# Patient Record
Sex: Male | Born: 1980 | Race: White | Hispanic: No | Marital: Single | Smoking: Current every day smoker
Health system: Southern US, Community
[De-identification: ages and names within clinical notes are randomized; demographics above are authoritative.]

---

## 2013-11-05 ENCOUNTER — Emergency Department (HOSPITAL_COMMUNITY)
Admission: EM | Admit: 2013-11-05 | Discharge: 2013-11-05 | Disposition: A | Discharge status: Home / Self Care | Payer: Medicaid - Out of State | Attending: Emergency Medicine | Admitting: Emergency Medicine

## 2013-11-05 ENCOUNTER — Emergency Department (HOSPITAL_COMMUNITY): Payer: Medicaid - Out of State

## 2013-11-05 ENCOUNTER — Encounter (HOSPITAL_COMMUNITY): Payer: Self-pay | Admitting: Emergency Medicine

## 2013-11-05 DIAGNOSIS — W208XXA Other cause of strike by thrown, projected or falling object, initial encounter: Secondary | ICD-10-CM | POA: Insufficient documentation

## 2013-11-05 DIAGNOSIS — Y9389 Activity, other specified: Secondary | ICD-10-CM | POA: Insufficient documentation

## 2013-11-05 DIAGNOSIS — S8990XA Unspecified injury of unspecified lower leg, initial encounter: Secondary | ICD-10-CM | POA: Diagnosis present

## 2013-11-05 DIAGNOSIS — F17200 Nicotine dependence, unspecified, uncomplicated: Secondary | ICD-10-CM | POA: Insufficient documentation

## 2013-11-05 DIAGNOSIS — S90122A Contusion of left lesser toe(s) without damage to nail, initial encounter: Secondary | ICD-10-CM

## 2013-11-05 DIAGNOSIS — Y9289 Other specified places as the place of occurrence of the external cause: Secondary | ICD-10-CM | POA: Diagnosis not present

## 2013-11-05 DIAGNOSIS — S99929A Unspecified injury of unspecified foot, initial encounter: Secondary | ICD-10-CM | POA: Diagnosis present

## 2013-11-05 DIAGNOSIS — S90129A Contusion of unspecified lesser toe(s) without damage to nail, initial encounter: Secondary | ICD-10-CM | POA: Insufficient documentation

## 2013-11-05 DIAGNOSIS — S99919A Unspecified injury of unspecified ankle, initial encounter: Secondary | ICD-10-CM

## 2013-11-05 MED ORDER — HYDROCODONE-ACETAMINOPHEN 5-325 MG PO TABS: 1.0000 | ORAL_TABLET | ORAL | Status: AC | PRN

## 2013-11-05 NOTE — ED Notes (Signed)
Pt in radiology 

## 2013-11-05 NOTE — ED Notes (Signed)
Declined W/C at D/C and was escorted to lobby by RN. 

## 2013-11-05 NOTE — ED Notes (Signed)
The pt has injured his lt little toe when a semi skirt fell onto it  1400 today bruised and swollen

## 2013-11-05 NOTE — ED Provider Notes (Signed)
CSN: [id number]     Arrival date & time 11/05/13  1649 History  This chart was scribed for Elpidio Anis, PA-C working with Samuel Jester, DO by Evon Slack, ED Scribe. This patient was seen in room TR04C/TR04C and the patient's care was started at 5:08 PM.    Chief Complaint  Patient presents with  . Toe Injury   The history is provided by the patient. No language interpreter was used.   HPI Comments: Benjamin Rowe is a 33 y.o. male who presents to the Emergency Department complaining of left crush injury to his left little toe onset 2PM today. He states that he dropped a semi side skirt on his foot. He denies numbness or tingling.    History reviewed. No pertinent past medical history. History reviewed. No pertinent past surgical history. No family history on file. History  Substance Use Topics  . Smoking status: Current Every Day Smoker  . Smokeless tobacco: Not on file  . Alcohol Use: Yes    Review of Systems  Musculoskeletal: Positive for arthralgias.  Skin: Positive for color change.  Neurological: Negative for numbness.    Allergies  Gabapentin  Home Medications   Prior to Admission medications   Not on File   Triage Vitals: BP 161/99  Pulse 92  Temp(Src) 99.4 F (37.4 C)  Resp 16  Ht 6' (1.829 m)  Wt 256 lb (116.121 kg)  BMI 34.71 kg/m2  SpO2 98%  Physical Exam  Nursing note and vitals reviewed. Constitutional: He is oriented to person, place, and time. He appears well-developed and well-nourished. No distress.  HENT:  Head: Normocephalic and atraumatic.  Eyes: Conjunctivae and EOM are normal.  Neck: Neck supple. No tracheal deviation present.  Cardiovascular: Normal rate.   Pulmonary/Chest: Effort normal. No respiratory distress.  Musculoskeletal: Normal range of motion. He exhibits tenderness.  5th toe of left foot almost completely ecchymotic, Neurovascularly Intact, nail is intact, no avulsion, 4th toe tender wit no deformity  or  discoloration, no metatarsal tenderness  Neurological: He is alert and oriented to person, place, and time.  Skin: Skin is warm and dry.  Psychiatric: He has a normal mood and affect. His behavior is normal.    ED Course  Procedures (including critical care time) DIAGNOSTIC STUDIES: Oxygen Saturation is 98% on RA, normal by my interpretation.    COORDINATION OF CARE: 5:15 PM-Discussed treatment plan which includes left 5th toe X-ray with pt at bedside and pt agreed to plan.     Labs Review Labs Reviewed - No data to display  Imaging Review No results found.   EKG Interpretation None      MDM   Final diagnoses:  None   1. Contusion, left 5th toe  No fracture on plain film x-ray. No nail avulsion. Toes buddy taped for comfort. Pain management provided.    I personally performed the services described in this documentation, which was scribed in my presence. The recorded information has been reviewed and is accurate.       Arnoldo Hooker, PA-C 11/05/13 1859

## 2013-11-05 NOTE — Discharge Instructions (Signed)
Contusion °A contusion is a deep bruise. Contusions are the result of an injury that caused bleeding under the skin. The contusion may turn blue, purple, or yellow. Minor injuries will give you a painless contusion, but more severe contusions may stay painful and swollen for a few weeks.  °CAUSES  °A contusion is usually caused by a blow, trauma, or direct force to an area of the body. °SYMPTOMS  °· Swelling and redness of the injured area. °· Bruising of the injured area. °· Tenderness and soreness of the injured area. °· Pain. °DIAGNOSIS  °The diagnosis can be made by taking a history and physical exam. An X-ray, CT scan, or MRI may be needed to determine if there were any associated injuries, such as fractures. °TREATMENT  °Specific treatment will depend on what area of the body was injured. In general, the best treatment for a contusion is resting, icing, elevating, and applying cold compresses to the injured area. Over-the-counter medicines may also be recommended for pain control. Ask your caregiver what the best treatment is for your contusion. °HOME CARE INSTRUCTIONS  °· Put ice on the injured area. °· Put ice in a plastic bag. °· Place a towel between your skin and the bag. °· Leave the ice on for 15-20 minutes, 3-4 times a day, or as directed by your health care provider. °· Only take over-the-counter or prescription medicines for pain, discomfort, or fever as directed by your caregiver. Your caregiver may recommend avoiding anti-inflammatory medicines (aspirin, ibuprofen, and naproxen) for 48 hours because these medicines may increase bruising. °· Rest the injured area. °· If possible, elevate the injured area to reduce swelling. °SEEK IMMEDIATE MEDICAL CARE IF:  °· You have increased bruising or swelling. °· You have pain that is getting worse. °· Your swelling or pain is not relieved with medicines. °MAKE SURE YOU:  °· Understand these instructions. °· Will watch your condition. °· Will get help right  away if you are not doing well or get worse. °Document Released: 11/30/2004 Document Revised: 02/25/2013 Document Reviewed: 12/26/2010 °[employer]® Patient Information ©2015 [employer]. This information is not intended to replace advice given to you by your health care provider. Make sure you discuss any questions you have with your health care provider. ° °Cryotherapy °Cryotherapy means treatment with cold. Ice or gel packs can be used to reduce both pain and swelling. Ice is the most helpful within the first 24 to 48 hours after an injury or flare-up from overusing a muscle or joint. Sprains, strains, spasms, burning pain, shooting pain, and aches can all be eased with ice. Ice can also be used when recovering from surgery. Ice is effective, has very few side effects, and is safe for most people to use. °PRECAUTIONS  °Ice is not a safe treatment option for people with: °· Raynaud phenomenon. This is a condition affecting small blood vessels in the extremities. Exposure to cold may cause your problems to return. °· Cold hypersensitivity. There are many forms of cold hypersensitivity, including: °¨ Cold urticaria. Red, itchy hives appear on the skin when the tissues begin to warm after being iced. °¨ Cold erythema. This is a red, itchy rash caused by exposure to cold. °¨ Cold hemoglobinuria. Red blood cells break down when the tissues begin to warm after being iced. The hemoglobin that carry oxygen are passed into the urine because they cannot combine with blood proteins fast enough. °· Numbness or altered sensitivity in the area being iced. °If you have any   of the following conditions, do not use ice until you have discussed cryotherapy with your caregiver: °· Heart conditions, such as arrhythmia, angina, or chronic heart disease. °· High blood pressure. °· Healing wounds or open skin in the area being iced. °· Current infections. °· Rheumatoid arthritis. °· Poor circulation. °· Diabetes. °Ice slows the blood flow  in the region it is applied. This is beneficial when trying to stop inflamed tissues from spreading irritating chemicals to surrounding tissues. However, if you expose your skin to cold temperatures for too long or without the proper protection, you can damage your skin or nerves. Watch for signs of skin damage due to cold. °HOME CARE INSTRUCTIONS °Follow these tips to use ice and cold packs safely. °· Place a dry or damp towel between the ice and skin. A damp towel will cool the skin more quickly, so you may need to shorten the time that the ice is used. °· For a more rapid response, add gentle compression to the ice. °· Ice for no more than 10 to 20 minutes at a time. The bonier the area you are icing, the less time it will take to get the benefits of ice. °· Check your skin after 5 minutes to make sure there are no signs of a poor response to cold or skin damage. °· Rest 20 minutes or more between uses. °· Once your skin is numb, you can end your treatment. You can test numbness by very lightly touching your skin. The touch should be so light that you do not see the skin dimple from the pressure of your fingertip. When using ice, most people will feel these normal sensations in this order: cold, burning, aching, and numbness. °· Do not use ice on someone who cannot communicate their responses to pain, such as small children or people with dementia. °HOW TO MAKE AN ICE PACK °Ice packs are the most common way to use ice therapy. Other methods include ice massage, ice baths, and cryosprays. Muscle creams that cause a cold, tingly feeling do not offer the same benefits that ice offers and should not be used as a substitute unless recommended by your caregiver. °To make an ice pack, do one of the following: °· Place crushed ice or a bag of frozen vegetables in a sealable plastic bag. Squeeze out the excess air. Place this bag inside another plastic bag. Slide the bag into a pillowcase or place a damp towel between  your skin and the bag. °· Mix 3 parts water with 1 part rubbing alcohol. Freeze the mixture in a sealable plastic bag. When you remove the mixture from the freezer, it will be slushy. Squeeze out the excess air. Place this bag inside another plastic bag. Slide the bag into a pillowcase or place a damp towel between your skin and the bag. °SEEK MEDICAL CARE IF: °· You develop white spots on your skin. This may give the skin a blotchy (mottled) appearance. °· Your skin turns blue or pale. °· Your skin becomes waxy or hard. °· Your swelling gets worse. °MAKE SURE YOU:  °· Understand these instructions. °· Will watch your condition. °· Will get help right away if you are not doing well or get worse. °Document Released: 10/17/2010 Document Revised: 07/07/2013 Document Reviewed: 10/17/2010 °[employer]® Patient Information ©2015 [employer]. This information is not intended to replace advice given to you by your health care provider. Make sure you discuss any questions you have with your health care provider. ° °

## 2013-11-07 NOTE — ED Provider Notes (Signed)
Medical screening examination/treatment/procedure(s) were performed by non-physician practitioner and as supervising physician I was immediately available for consultation/collaboration.   EKG Interpretation None        Tamya Denardo, DO 11/07/13 2008 

## 2015-04-01 IMAGING — CR DG TOE 5TH 2+V*L*
3 series · 3 of 3 positions shown · non-contrast
Comparison: None.

CLINICAL DATA: Toe injury

EXAM:
DG TOE 5TH LEFT

[x toes ap left]
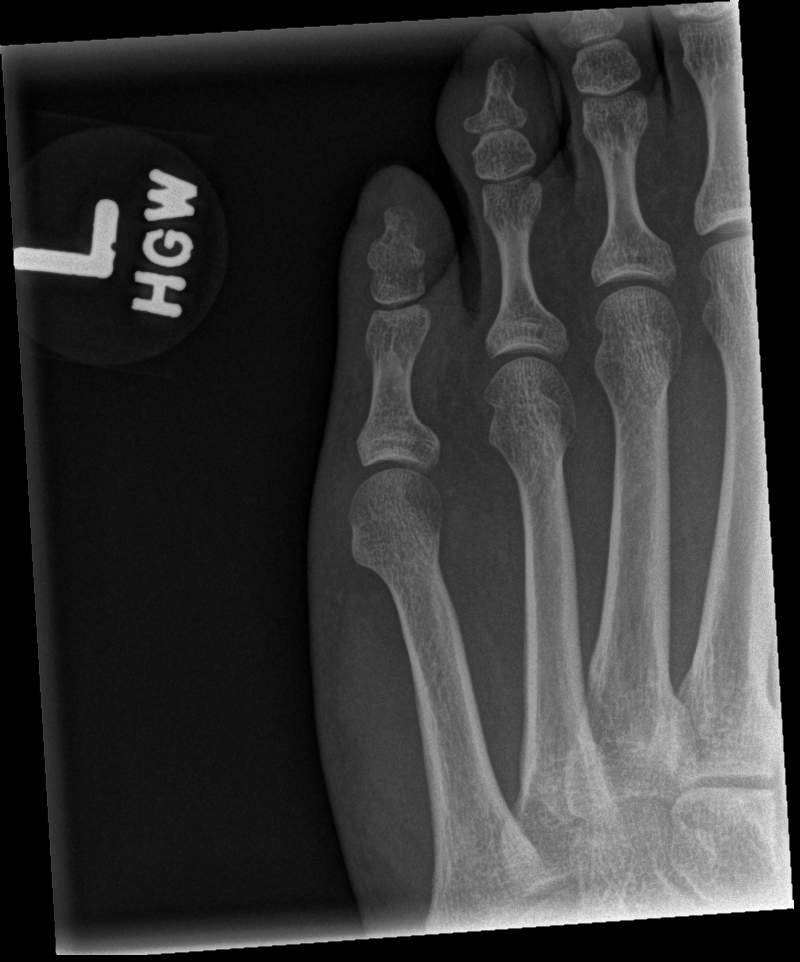

[x toes obl left]
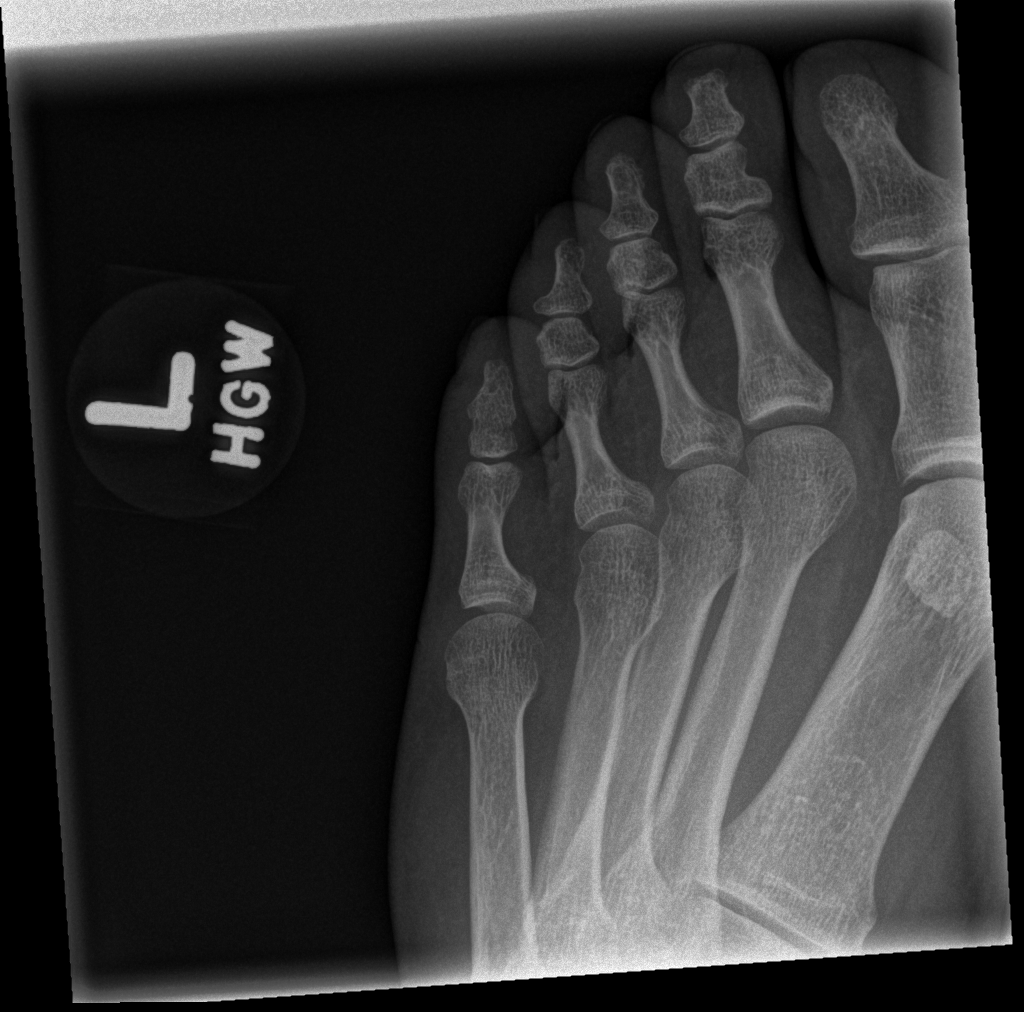

[x toes lat left]
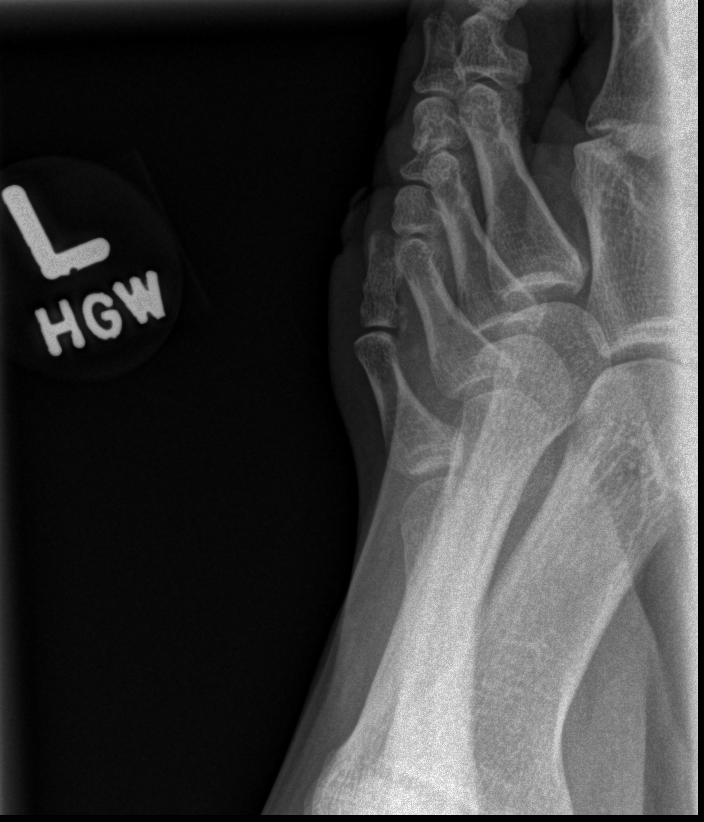

[3 of 3 positions shown; findings below may reference images not displayed]

FINDINGS: There is no evidence of fracture or dislocation. Soft tissues are
unremarkable.
IMPRESSION: Negative.
